# Patient Record
Sex: Female | Born: 1960 | Race: White | Hispanic: No | Marital: Married | Smoking: Former smoker
Health system: Southern US, Community
[De-identification: ages and names within clinical notes are randomized; demographics above are authoritative.]

## PROBLEM LIST (undated history)

## (undated) HISTORY — PX: DILATION AND CURETTAGE OF UTERUS: SHX78

## (undated) HISTORY — PX: OVARIAN CYST REMOVAL: SHX89

## (undated) HISTORY — PX: TONSILLECTOMY: SUR1361

## (undated) HISTORY — PX: CHOLECYSTECTOMY: SHX55

## (undated) HISTORY — PX: ABDOMINAL HYSTERECTOMY: SHX81

---

## 2005-11-17 ENCOUNTER — Ambulatory Visit: Payer: Self-pay | Admitting: Unknown Physician Specialty

## 2006-11-02 ENCOUNTER — Emergency Department: Payer: Self-pay | Admitting: Emergency Medicine

## 2008-01-01 ENCOUNTER — Encounter: Payer: Self-pay | Admitting: Internal Medicine

## 2008-01-01 ENCOUNTER — Ambulatory Visit: Payer: Self-pay | Admitting: Family Medicine

## 2008-01-02 ENCOUNTER — Encounter: Payer: Self-pay | Admitting: Internal Medicine

## 2008-01-18 ENCOUNTER — Ambulatory Visit: Payer: Self-pay | Admitting: Unknown Physician Specialty

## 2008-06-06 ENCOUNTER — Emergency Department: Payer: Self-pay | Admitting: Emergency Medicine

## 2008-06-06 ENCOUNTER — Other Ambulatory Visit: Payer: Self-pay

## 2008-09-12 ENCOUNTER — Ambulatory Visit: Payer: Self-pay | Admitting: Unknown Physician Specialty

## 2008-09-24 ENCOUNTER — Ambulatory Visit: Payer: Self-pay | Admitting: Unknown Physician Specialty

## 2009-04-01 ENCOUNTER — Ambulatory Visit: Payer: Self-pay | Admitting: General Surgery

## 2015-06-14 ENCOUNTER — Emergency Department: Payer: BLUE CROSS/BLUE SHIELD

## 2015-06-14 ENCOUNTER — Emergency Department
Admission: EM | Admit: 2015-06-14 | Discharge: 2015-06-14 | Disposition: A | Discharge status: Home / Self Care | Payer: BLUE CROSS/BLUE SHIELD | Attending: Emergency Medicine | Admitting: Emergency Medicine

## 2015-06-14 DIAGNOSIS — Z87891 Personal history of nicotine dependence: Secondary | ICD-10-CM | POA: Diagnosis not present

## 2015-06-14 DIAGNOSIS — R1031 Right lower quadrant pain: Secondary | ICD-10-CM | POA: Diagnosis present

## 2015-06-14 DIAGNOSIS — R102 Pelvic and perineal pain: Secondary | ICD-10-CM

## 2015-06-14 DIAGNOSIS — N858 Other specified noninflammatory disorders of uterus: Secondary | ICD-10-CM | POA: Diagnosis not present

## 2015-06-14 LAB — COMPREHENSIVE METABOLIC PANEL
ALT: 17 U/L (ref 14–54)
ANION GAP: 6 (ref 5–15)
AST: 16 U/L (ref 15–41)
Albumin: 4.3 g/dL (ref 3.5–5.0)
Alkaline Phosphatase: 74 U/L (ref 38–126)
BILIRUBIN TOTAL: 0.8 mg/dL (ref 0.3–1.2)
BUN: 12 mg/dL (ref 6–20)
CHLORIDE: 105 mmol/L (ref 101–111)
CO2: 27 mmol/L (ref 22–32)
Calcium: 8.8 mg/dL — ABNORMAL LOW (ref 8.9–10.3)
Creatinine, Ser: 0.65 mg/dL (ref 0.44–1.00)
GFR calc Af Amer: >60 mL/min (ref >60)
Glucose, Bld: 111 mg/dL — ABNORMAL HIGH (ref 65–99)
Potassium: 4 mmol/L (ref 3.5–5.1)
Sodium: 138 mmol/L (ref 135–145)
TOTAL PROTEIN: 7.5 g/dL (ref 6.5–8.1)

## 2015-06-14 LAB — URINALYSIS COMPLETE WITH MICROSCOPIC (ARMC ONLY)
BACTERIA UA: NONE SEEN
Bilirubin Urine: NEGATIVE
Glucose, UA: NEGATIVE mg/dL
HGB URINE DIPSTICK: NEGATIVE
Ketones, ur: NEGATIVE mg/dL
Leukocytes, UA: NEGATIVE
NITRITE: NEGATIVE
PH: 6 (ref 5.0–8.0)
PROTEIN: NEGATIVE mg/dL
RBC / HPF: NONE SEEN RBC/hpf (ref 0–5)
SQUAMOUS EPITHELIAL / LPF: NONE SEEN
Specific Gravity, Urine: 1.004 — ABNORMAL LOW (ref 1.005–1.030)
WBC UA: NONE SEEN WBC/hpf (ref 0–5)

## 2015-06-14 LAB — CBC
HEMATOCRIT: 42.9 % (ref 35.0–47.0)
HEMOGLOBIN: 13.9 g/dL (ref 12.0–16.0)
MCH: 29.1 pg (ref 26.0–34.0)
MCHC: 32.3 g/dL (ref 32.0–36.0)
MCV: 89.9 fL (ref 80.0–100.0)
Platelets: 278 10*3/uL (ref 150–440)
RBC: 4.77 MIL/uL (ref 3.80–5.20)
RDW: 13.1 % (ref 11.5–14.5)
WBC: 5.1 10*3/uL (ref 3.6–11.0)

## 2015-06-14 MED ORDER — IOHEXOL 240 MG/ML SOLN
25.0000 mL | Freq: Once | INTRAMUSCULAR | Status: AC | PRN
  Administered 2015-06-14: 25 mL via ORAL

## 2015-06-14 MED ORDER — IOHEXOL 300 MG/ML  SOLN
100.0000 mL | Freq: Once | INTRAMUSCULAR | Status: AC | PRN
  Administered 2015-06-14: 100 mL via INTRAVENOUS

## 2015-06-14 MED ORDER — DICYCLOMINE HCL 10 MG PO CAPS
20.0000 mg | ORAL_CAPSULE | Freq: Once | ORAL | Status: AC
Start: 2015-06-14 — End: 2015-06-14
  Administered 2015-06-14: 20 mg via ORAL
  Filled 2015-06-14: qty 2

## 2015-06-14 MED ORDER — DICYCLOMINE HCL 20 MG PO TABS
20.0000 mg | ORAL_TABLET | Freq: Three times a day (TID) | ORAL | Status: AC | PRN
Start: 2015-06-14 — End: 2016-06-13

## 2015-06-14 NOTE — ED Notes (Signed)
Discharge instructions, follow-up care, and prescription reviewed with patient. No questions or concerns at this time.  

## 2015-06-14 NOTE — ED Notes (Signed)
Pt here c/o RLQ abdominal pain that began yesterday after lunch  Nausea present  Normal BM this am  8/10 pain reported

## 2015-06-14 NOTE — Discharge Instructions (Signed)
Please seek medical attention for any high fevers, chest pain, shortness of breath, change in behavior, persistent vomiting, bloody stool or any other new or concerning symptoms. ° °Abdominal Pain °Many things can cause abdominal pain. Usually, abdominal pain is not caused by a disease and will improve without treatment. It can often be observed and treated at home. Your health care provider will do a physical exam and possibly order blood tests and X-rays to help determine the seriousness of your pain. However, in many cases, more time must pass before a clear cause of the pain can be found. Before that point, your health care provider may not know if you need more testing or further treatment. °HOME CARE INSTRUCTIONS  °Monitor your abdominal pain for any changes. The following actions may help to alleviate any discomfort you are experiencing: °· Only take over-the-counter or prescription medicines as directed by your health care provider. °· Do not take laxatives unless directed to do so by your health care provider. °· Try a clear liquid diet (broth, tea, or water) as directed by your health care provider. Slowly move to a Sayer diet as tolerated. °SEEK MEDICAL CARE IF: °· You have unexplained abdominal pain. °· You have abdominal pain associated with nausea or diarrhea. °· You have pain when you urinate or have a bowel movement. °· You experience abdominal pain that wakes you in the night. °· You have abdominal pain that is worsened or improved by eating food. °· You have abdominal pain that is worsened with eating fatty foods. °· You have a fever. °SEEK IMMEDIATE MEDICAL CARE IF:  °· Your pain does not go away within 2 hours. °· You keep throwing up (vomiting). °· Your pain is felt only in portions of the abdomen, such as the right side or the left lower portion of the abdomen. °· You pass bloody or black tarry stools. °MAKE SURE YOU: °· Understand these instructions.   °· Will watch your condition.   °· Will  get help right away if you are not doing well or get worse.   °Document Released: 06/29/2005 Document Revised: 09/24/2013 Document Reviewed: 05/29/2013 °[employer]® Patient Information ©2015 [employer]. This information is not intended to replace advice given to you by your health care provider. Make sure you discuss any questions you have with your health care provider. ° °

## 2015-06-14 NOTE — ED Notes (Signed)
CT notified pt done with PO contrast

## 2015-06-14 NOTE — ED Notes (Signed)
Patient transported to CT 

## 2015-06-14 NOTE — ED Provider Notes (Signed)
[facility] Emergency Department Provider Note    ____________________________________________  Time seen: 0935  I have reviewed the triage vital signs and the nursing notes.   HISTORY  Chief Complaint Abdominal Pain and Nausea   History limited by: Not Limited   HPI Jenny Johnston is a 54 y.o. female who presents to the emergency department today with right lower quadrant pain. She states the pain started yesterday at around noon. She states that the pain initially was in the central part of her stomach but now has moved to the right lower quadrant. She describes it as a constant dull pain with occasional sharp periods. She states that she has had some nausea but no vomiting. She did have a bowel movement this morning. She did not notice any blood. No pain with urination. No fevers but she has had chills.     History reviewed. No pertinent past medical history.  There are no active problems to display for this patient.   Past Surgical History  Procedure Laterality Date  . Cholecystectomy    . Abdominal hysterectomy    . Cesarean section    . Ovarian cyst removal    . Tonsillectomy    . Dilation and curettage of uterus      No current outpatient prescriptions on file.  Allergies Other and Shellfish allergy  History reviewed. No pertinent family history.  Social History Social History  Substance Use Topics  . Smoking status: Former Games developer  . Smokeless tobacco: None  . Alcohol Use: No    Review of Systems  Constitutional: positive for chills Cardiovascular: Negative for chest pain. Respiratory: Negative for shortness of breath. Gastrointestinal: positive for right lower quadrant pain Genitourinary: Negative for dysuria. Musculoskeletal: Negative for back pain. Skin: Negative for rash. Neurological: Negative for headaches, focal weakness or numbness.  10-point ROS otherwise  negative.  ____________________________________________   PHYSICAL EXAM:  VITAL SIGNS: ED Triage Vitals  Enc Vitals Group     BP 06/14/15 0851 122/57 mmHg     Pulse Rate 06/14/15 0851 63     Resp --      Temp 06/14/15 0851 97.6 F (36.4 C)     Temp Source 06/14/15 0851 Oral     SpO2 06/14/15 0851 96 %     Weight 06/14/15 0851 260 lb (117.935 kg)     Height 06/14/15 0851 5\' 2"  (1.575 m)     Head Cir --      Peak Flow --      Pain Score 06/14/15 0852 8   Constitutional: Alert and oriented. Well appearing and in no distress. Eyes: Conjunctivae are normal. PERRL. Normal extraocular movements. ENT   Head: Normocephalic and atraumatic.   Nose: No congestion/rhinnorhea.   Mouth/Throat: Mucous membranes are moist.   Neck: No stridor. Hematological/Lymphatic/Immunilogical: No cervical lymphadenopathy. Cardiovascular: Normal rate, regular rhythm.  No murmurs, rubs, or gallops. Respiratory: Normal respiratory effort without tachypnea nor retractions. Breath sounds are clear and equal bilaterally. No wheezes/rales/rhonchi. Gastrointestinal: Soft and tender to palpation in the right lower quadrant. No distention. There is no CVA tenderness. Genitourinary: Deferred Musculoskeletal: Normal range of motion in all extremities. No joint effusions.  No lower extremity tenderness nor edema. Neurologic:  Normal speech and language. No gross focal neurologic deficits are appreciated. Speech is normal.  Skin:  Skin is warm, dry and intact. No rash noted. Psychiatric: Mood and affect are normal. Speech and behavior are normal. Patient exhibits appropriate insight and judgment.  ____________________________________________  LABS (pertinent positives/negatives)  Labs Reviewed  COMPREHENSIVE METABOLIC PANEL - Abnormal; Notable for the following:    Glucose, Bld 111 (*)    Calcium 8.8 (*)    All other components within normal limits  URINALYSIS COMPLETEWITH MICROSCOPIC ([facility]  ONLY) - Abnormal; Notable for the following:    Color, Urine COLORLESS (*)    APPearance CLEAR (*)    Specific Gravity, Urine 1.004 (*)    All other components within normal limits  CBC     ____________________________________________   EKG  None  ____________________________________________    RADIOLOGY  CT abd/pel  IMPRESSION: 1. No acute findings identified within the abdomen or pelvis. The appendix is not visualized. No secondary signs of acute appendicitis noted in the right lower quadrant of the abdomen.  I, Phineas Semen, personally viewed and evaluated these images (CT abd/pel) as part of my medical decision making.   US Transvaginal  IMPRESSION: Post hysterectomy exam. Neither ovary is seen but no adnexal mass is identified. ____________________________________________   PROCEDURES  Procedure(s) performed: None  Critical Care performed: No  ____________________________________________   INITIAL IMPRESSION / ASSESSMENT AND PLAN / ED COURSE  Pertinent labs & imaging results that were available during my care of the patient were reviewed by me and considered in my medical decision making (see chart for details).  Patient presented to the emergency department today with right lower quadrant pain. Initial thought was that this could represent appendicitis given that it initially started in the central region and migrated to the right lower quadrant. CT scan was obtained which did not show any signs of appendicitis or other intra-abdominal pathology. An ultrasound was then obtained which and partially cannot visualize the ovaries however did not have any other concerning findings. At this point given reassuring blood work will have patient follow-up with primary care. Encouraged PCP and OB/GYN follow-up. Will discharge home with course of Bentyl. Discussed return precautions.  ____________________________________________   FINAL CLINICAL IMPRESSION(S) /  ED DIAGNOSES  Final diagnoses:  Right adnexal tenderness  Right lower quadrant pain     Phineas Semen, MD 06/14/15 1423

## 2015-08-17 ENCOUNTER — Emergency Department: Payer: BLUE CROSS/BLUE SHIELD

## 2015-08-17 ENCOUNTER — Emergency Department
Admission: EM | Admit: 2015-08-17 | Discharge: 2015-08-17 | Disposition: A | Discharge status: Home / Self Care | Payer: BLUE CROSS/BLUE SHIELD | Attending: Emergency Medicine | Admitting: Emergency Medicine

## 2015-08-17 ENCOUNTER — Encounter: Payer: Self-pay | Admitting: Emergency Medicine

## 2015-08-17 DIAGNOSIS — Y998 Other external cause status: Secondary | ICD-10-CM | POA: Insufficient documentation

## 2015-08-17 DIAGNOSIS — Z23 Encounter for immunization: Secondary | ICD-10-CM | POA: Insufficient documentation

## 2015-08-17 DIAGNOSIS — S060X0A Concussion without loss of consciousness, initial encounter: Secondary | ICD-10-CM | POA: Diagnosis not present

## 2015-08-17 DIAGNOSIS — Z87891 Personal history of nicotine dependence: Secondary | ICD-10-CM | POA: Diagnosis not present

## 2015-08-17 DIAGNOSIS — S20212A Contusion of left front wall of thorax, initial encounter: Secondary | ICD-10-CM

## 2015-08-17 DIAGNOSIS — Y9389 Activity, other specified: Secondary | ICD-10-CM | POA: Insufficient documentation

## 2015-08-17 DIAGNOSIS — E041 Nontoxic single thyroid nodule: Secondary | ICD-10-CM

## 2015-08-17 DIAGNOSIS — Y92410 Unspecified street and highway as the place of occurrence of the external cause: Secondary | ICD-10-CM | POA: Insufficient documentation

## 2015-08-17 DIAGNOSIS — Z79899 Other long term (current) drug therapy: Secondary | ICD-10-CM | POA: Insufficient documentation

## 2015-08-17 DIAGNOSIS — S0101XA Laceration without foreign body of scalp, initial encounter: Secondary | ICD-10-CM

## 2015-08-17 DIAGNOSIS — S0990XA Unspecified injury of head, initial encounter: Secondary | ICD-10-CM | POA: Diagnosis present

## 2015-08-17 MED ORDER — LIDOCAINE-EPINEPHRINE (PF) 1 %-1:200000 IJ SOLN
INTRAMUSCULAR | Status: AC
  Administered 2015-08-17: 20:00:00
  Filled 2015-08-17: qty 30

## 2015-08-17 MED ORDER — TETANUS-DIPHTHERIA TOXOIDS TD 5-2 LFU IM INJ
0.5000 mL | INJECTION | Freq: Once | INTRAMUSCULAR | Status: AC
  Administered 2015-08-17: 0.5 mL via INTRAMUSCULAR
  Filled 2015-08-17: qty 0.5
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
## 2015-08-17 NOTE — ED Notes (Signed)
Patient transported to CT 

## 2015-08-17 NOTE — Discharge Instructions (Signed)
Concussion, Adult A concussion is a brain injury. It is caused by:  A hit to the head.  A quick and sudden movement (jolt) of the head or neck. A concussion is usually not life threatening. Even so, it can cause serious problems. If you had a concussion before, you may have concussion-like problems after a hit to your head. HOME CARE General Instructions  Follow your doctor's directions carefully.  Take medicines only as told by your doctor.  Only take medicines your doctor says are safe.  Do not drink alcohol until your doctor says it is okay. Alcohol and some drugs can slow down healing. They can also put you at risk for further injury.  If you are having trouble remembering things, write them down.  Try to do one thing at a time if you get distracted easily. For example, do not watch TV while making dinner.  Talk to your family members or close friends when making important decisions.  Follow up with your doctor as told.  Watch your symptoms. Tell others to do the same. Serious problems can sometimes happen after a concussion. Older adults are more likely to have these problems.  Tell your teachers, school nurse, school counselor, coach, Product/process development scientist, or work Freight forwarder about your concussion. Tell them about what you can or cannot do. They should watch to see if:  It gets even harder for you to pay attention or concentrate.  It gets even harder for you to remember things or learn new things.  You need more time than normal to finish things.  You become annoyed (irritable) more than before.  You are not able to deal with stress as well.  You have more problems than before.  Rest. Make sure you:  Get plenty of sleep at night.  Go to sleep early.  Go to bed at the same time every day. Try to wake up at the same time.  Rest during the day.  Take naps when you feel tired.  Limit activities where you have to think a lot or concentrate. These include:  Doing  homework.  Doing work related to a job.  Watching TV.  Using the computer. Returning To Your Regular Activities Return to your normal activities slowly, not all at once. You must give your body and brain enough time to heal.   Do not play sports or do other athletic activities until your doctor says it is okay.  Ask your doctor when you can drive, ride a bicycle, or work other vehicles or machines. Never do these things if you feel dizzy.  Ask your doctor about when you can return to work or school. Preventing Another Concussion It is very important to avoid another brain injury, especially before you have healed. In rare cases, another injury can lead to permanent brain damage, brain swelling, or death. The risk of this is greatest during the first 7-10 days after your injury. Avoid injuries by:   Wearing a seat belt when riding in a car.  Not drinking too much alcohol.  Avoiding activities that could lead to a second concussion (such as contact sports).  Wearing a helmet when doing activities like:  Biking.  Skiing.  Skateboarding.  Skating.  Making your home safer by:  Removing things from the floor or stairways that could make you trip.  Using grab bars in bathrooms and handrails by stairs.  Placing non-slip mats on floors and in bathtubs.  Improve lighting in dark areas. GET HELP IF:  It gets even harder for you to pay attention or concentrate.  It gets even harder for you to remember things or learn new things.  You need more time than normal to finish things.  You become annoyed (irritable) more than before.  You are not able to deal with stress as well.  You have more problems than before.  You have problems keeping your balance.  You are not able to react quickly when you should. Get help if you have any of these problems for more than 2 weeks:   Lasting (chronic) headaches.  Dizziness or trouble balancing.  Feeling sick to your stomach  (nausea).  Seeing (vision) problems.  Being affected by noises or light more than normal.  Feeling sad, low, down in the dumps, blue, gloomy, or empty (depressed).  Mood changes (mood swings).  Feeling of fear or nervousness about what may happen (anxiety).  Feeling annoyed.  Memory problems.  Problems concentrating or paying attention.  Sleep problems.  Feeling tired all the time. GET HELP RIGHT AWAY IF:   You have bad headaches or your headaches get worse.  You have weakness (even if it is in one hand, leg, or part of the face).  You have loss of feeling (numbness).  You feel off balance.  You keep throwing up (vomiting).  You feel tired.  One black center of your eye (pupil) is larger than the other.  You twitch or shake violently (convulse).  Your speech is not clear (slurred).  You are more confused, easily angered (agitated), or annoyed than before.  You have more trouble resting than before.  You are unable to recognize people or places.  You have neck pain.  It is difficult to wake you up.  You have unusual behavior changes.  You pass out (lose consciousness). MAKE SURE YOU:   Understand these instructions.  Will watch your condition.  Will get help right away if you are not doing well or get worse.   This information is not intended to replace advice given to you by your health care provider. Make sure you discuss any questions you have with your health care provider.   Document Released: 09/07/2009 Document Revised: 10/10/2014 Document Reviewed: 04/11/2013 Elsevier Interactive Patient Education 2016 [employer] Inc[employer]  Blunt Chest Trauma Blunt chest trauma is an injury caused by a blow to the chest. These chest injuries can be very painful. Blunt chest trauma often results in bruised or broken (fractured) ribs. Most cases of bruised and fractured ribs from blunt chest traumas get better after 1 to 3 weeks of rest and pain medicine. Often, the  soft tissue in the chest wall is also injured, causing pain and bruising. Internal organs, such as the heart and lungs, may also be injured. Blunt chest trauma can lead to serious medical problems. This injury requires immediate medical care. CAUSES   Motor vehicle collisions.  Falls.  Physical violence.  Sports injuries. SYMPTOMS   Chest pain. The pain may be worse when you move or breathe deeply.  Shortness of breath.  Lightheadedness.  Bruising.  Tenderness.  Swelling. DIAGNOSIS  Your caregiver will do a physical exam. X-rays may be taken to look for fractures. However, minor rib fractures may not show up on X-rays until a few days after the injury. If a more serious injury is suspected, further imaging tests may be done. This may include ultrasounds, computed tomography (CT) scans, or magnetic resonance imaging (MRI). TREATMENT  Treatment depends on the severity of your  injury. Your caregiver may prescribe pain medicines and deep breathing exercises. HOME CARE INSTRUCTIONS  Limit your activities until you can move around without much pain.  Do not do any strenuous work until your injury is healed.  Put ice on the injured area.  Put ice in a plastic bag.  Place a towel between your skin and the bag.  Leave the ice on for 15-20 minutes, 03-04 times a day.  You may wear a rib belt as directed by your caregiver to reduce pain.  Practice deep breathing as directed by your caregiver to keep your lungs clear.  Only take over-the-counter or prescription medicines for pain, fever, or discomfort as directed by your caregiver. SEEK IMMEDIATE MEDICAL CARE IF:   You have increasing pain or shortness of breath.  You cough up blood.  You have nausea, vomiting, or abdominal pain.  You have a fever.  You feel dizzy, weak, or you faint. MAKE SURE YOU:  Understand these instructions.  Will watch your condition.  Will get help right away if you are not doing well or get  worse.   This information is not intended to replace advice given to you by your health care provider. Make sure you discuss any questions you have with your health care provider.   Document Released: 10/27/2004 Document Revised: 10/10/2014 Document Reviewed: 03/18/2015 Elsevier Interactive Patient Education 2016 [employer] Inc[employer]  Laceration Care, Adult A laceration is a cut that goes through all of the layers of the skin and into the tissue that is right under the skin. Some lacerations heal on their own. Others need to be closed with stitches (sutures), staples, skin adhesive strips, or skin glue. Proper laceration care minimizes the risk of infection and helps the laceration to heal better. HOW TO CARE FOR YOUR LACERATION If sutures or staples were used:  Keep the wound clean and dry.  If you were given a bandage (dressing), you should change it at least one time per day or as told by your health care provider. You should also change it if it becomes wet or dirty.  Keep the wound completely dry for the first 24 hours or as told by your health care provider. After that time, you may shower or bathe. However, make sure that the wound is not soaked in water until after the sutures or staples have been removed.  Clean the wound one time each day or as told by your health care provider:  Wash the wound with soap and water.  Rinse the wound with water to remove all soap.  Pat the wound dry with a clean towel. Do not rub the wound.  After cleaning the wound, apply a thin layer of antibiotic ointmentas told by your health care provider. This will help to prevent infection and keep the dressing from sticking to the wound.  Have the sutures or staples removed as told by your health care provider. If skin adhesive strips were used:  Keep the wound clean and dry.  If you were given a bandage (dressing), you should change it at least one time per day or as told by your health care provider. You  should also change it if it becomes dirty or wet.  Do not get the skin adhesive strips wet. You may shower or bathe, but be careful to keep the wound dry.  If the wound gets wet, pat it dry with a clean towel. Do not rub the wound.  Skin adhesive strips fall off on their  own. You may trim the strips as the wound heals. Do not remove skin adhesive strips that are still stuck to the wound. They will fall off in time. If skin glue was used:  Try to keep the wound dry, but you may briefly wet it in the shower or bath. Do not soak the wound in water, such as by swimming.  After you have showered or bathed, gently pat the wound dry with a clean towel. Do not rub the wound.  Do not do any activities that will make you sweat heavily until the skin glue has fallen off on its own.  Do not apply liquid, cream, or ointment medicine to the wound while the skin glue is in place. Using those may loosen the film before the wound has healed.  If you were given a bandage (dressing), you should change it at least one time per day or as told by your health care provider. You should also change it if it becomes dirty or wet.  If a dressing is placed over the wound, be careful not to apply tape directly over the skin glue. Doing that may cause the glue to be pulled off before the wound has healed.  Do not pick at the glue. The skin glue usually remains in place for 5-10 days, then it falls off of the skin. General Instructions  Take over-the-counter and prescription medicines only as told by your health care provider.  If you were prescribed an antibiotic medicine or ointment, take or apply it as told by your doctor. Do not stop using it even if your condition improves.  To help prevent scarring, make sure to cover your wound with sunscreen whenever you are outside after stitches are removed, after adhesive strips are removed, or when glue remains in place and the wound is healed. Make sure to wear a sunscreen  of at least 30 SPF.  Do not scratch or pick at the wound.  Keep all follow-up visits as told by your health care provider. This is important.  Check your wound every day for signs of infection. Watch for:  Redness, swelling, or pain.  Fluid, blood, or pus.  Raise (elevate) the injured area above the level of your heart while you are sitting or lying down, if possible. SEEK MEDICAL CARE IF:  You received a tetanus shot and you have swelling, severe pain, redness, or bleeding at the injection site.  You have a fever.  A wound that was closed breaks open.  You notice a bad smell coming from your wound or your dressing.  You notice something coming out of the wound, such as wood or glass.  Your pain is not controlled with medicine.  You have increased redness, swelling, or pain at the site of your wound.  You have fluid, blood, or pus coming from your wound.  You notice a change in the color of your skin near your wound.  You need to change the dressing frequently due to fluid, blood, or pus draining from the wound.  You develop a new rash.  You develop numbness around the wound. SEEK IMMEDIATE MEDICAL CARE IF:  You develop severe swelling around the wound.  Your pain suddenly increases and is severe.  You develop painful lumps near the wound or on skin that is anywhere on your body.  You have a red streak going away from your wound.  The wound is on your hand or foot and you cannot properly move a finger or  toe.  The wound is on your hand or foot and you notice that your fingers or toes look pale or bluish.   This information is not intended to replace advice given to you by your health care provider. Make sure you discuss any questions you have with your health care provider.   Document Released: 09/19/2005 Document Revised: 02/03/2015 Document Reviewed: 09/15/2014 Elsevier Interactive Patient Education 2016 [employer] Inc.  Thyroid Nodule A thyroid nodule is an  isolatedgrowth of thyroid cells that forms a lump in your thyroid gland. The thyroid gland is a butterfly-shaped gland. It is found in the lower front of your neck. This gland sends chemical messengers (hormones) through your blood to all parts of your body. These hormones are important in regulating your body temperature and helping your body to use energy. Thyroid nodules are common. Most are not cancerous (are benign). You may have one nodule or several nodules.  Different types of thyroid nodules include:  Nodules that grow and fill with fluid (thyroid cysts).  Nodules that produce too much thyroid hormone (hot nodules or hyperthyroid).  Nodules that produce no thyroid hormone (cold nodules or hypothyroid).  Nodules that form from cancer cells (thyroid cancers). CAUSES Usually, the cause of this condition is not known. RISK FACTORS Factors that make this condition more likely to develop include:  Increasing age. Thyroid nodules become more common in people who are older than 52 years of age.  Gender.  Benign thyroid nodules are more common in women.  Cancerous (malignant) thyroid nodules are more common in men.  A family history that includes:  Thyroid nodules.  Pheochromocytoma.  Thyroid carcinoma.  Hyperparathyroidism.  Certain kinds of thyroid diseases, such as Hashimoto thyroiditis.  Lack of iodine.  A history of head and neck radiation, such as from X-rays. SYMPTOMS It is common for this condition to cause no symptoms. If you have symptoms, they may include:  A lump in your lower neck.  Feeling a lump or tickle in your throat.  Pain in your neck, jaw, or ear.  Having trouble swallowing. Hot nodules may cause symptoms that include:  Weight loss.  Warm, flushed skin.  Feeling hot.  Feeling nervous.  A racing heartbeat. Cold nodules may cause symptoms that include:  Weight gain.  Dry skin.  Brittle hair. This may also occur with hair  loss.  Feeling cold.  Fatigue. Thyroid cancer nodules may cause symptoms that include:  Hard nodules that feel stuck to the thyroid gland.  Hoarseness.  Lumps in the glands near your thyroid (lymph nodes). DIAGNOSIS A thyroid nodule may be felt by your health care provider during a physical exam. This condition may also be diagnosed based on your symptoms. You may also have tests, including:  An ultrasound. This may be done to confirm the diagnosis.  A biopsy. This involves taking a sample from the nodule and looking at it under a microscope to see if the nodule is benign.  Blood tests to make sure that your thyroid is working properly.  Imaging tests such as MRI or CT scan may be done if:  Your nodule is large.  Your nodule is blocking your airway.  Cancer is suspected. TREATMENT Treatment depends on the cause and size of your nodule or nodules. If the nodule is benign, treatment may not be necessary. Your health care provider may monitor the nodule to see if it goes away without treatment. If the nodule continues to grow, is cancerous, or does not go away:  It may need to be drained with a needle.  It may need to be removed with surgery. If you have surgery, part or all of your thyroid gland may need to be removed as well. HOME CARE INSTRUCTIONS  Pay attention to any changes in your nodule.  Take over-the-counter and prescription medicines only as told by your health care provider.  Keep all follow-up visits as told by your health care provider. This is important. SEEK MEDICAL CARE IF:  Your voice changes.  You have trouble swallowing.  You have pain in your neck, ear, or jaw that is getting worse.  Your nodule gets bigger.  Your nodule starts to make it harder for you to breathe. SEEK IMMEDIATE MEDICAL CARE IF:  You have a sudden fever.  You feel very weak.  Your muscles look like they are shrinking (muscle wasting).  You have mood swings.  You feel  very restless.  You feel confused.  You are seeing or hearing things that other people do not see or hear (having hallucinations).  You feel suddenly nauseous or throw up.  You suddenly have diarrhea.  You have chest pain.  There is a loss of consciousness.   This information is not intended to replace advice given to you by your health care provider. Make sure you discuss any questions you have with your health care provider.   Document Released: 08/12/2004 Document Revised: 06/10/2015 Document Reviewed: 12/31/2014 Elsevier Interactive Patient Education [employer]

## 2016-06-04 ENCOUNTER — Encounter (HOSPITAL_COMMUNITY): Payer: Self-pay | Admitting: *Deleted

## 2016-06-04 ENCOUNTER — Emergency Department (HOSPITAL_COMMUNITY)
Admission: EM | Admit: 2016-06-04 | Discharge: 2016-06-04 | Disposition: A | Discharge status: Home / Self Care | Payer: BLUE CROSS/BLUE SHIELD | Attending: Emergency Medicine | Admitting: Emergency Medicine

## 2016-06-04 DIAGNOSIS — L03031 Cellulitis of right toe: Secondary | ICD-10-CM | POA: Diagnosis not present

## 2016-06-04 DIAGNOSIS — Z87891 Personal history of nicotine dependence: Secondary | ICD-10-CM | POA: Insufficient documentation

## 2016-06-04 DIAGNOSIS — L539 Erythematous condition, unspecified: Secondary | ICD-10-CM | POA: Diagnosis present

## 2016-06-04 MED ORDER — SULFAMETHOXAZOLE-TRIMETHOPRIM 800-160 MG PO TABS
1.0000 | ORAL_TABLET | Freq: Once | ORAL | Status: AC
  Administered 2016-06-04: 1 via ORAL
  Filled 2016-06-04: qty 1

## 2016-06-04 MED ORDER — SULFAMETHOXAZOLE-TRIMETHOPRIM 800-160 MG PO TABS: 1.0000 | ORAL_TABLET | Freq: Two times a day (BID) | ORAL | 0 refills | Status: AC

## 2016-06-04 NOTE — ED Triage Notes (Signed)
Pt has a reddened area on the right lower part of her leg. Pt also has a "bump" on the right foot with bruising in between her big toe and the toe beside it.

## 2016-06-04 NOTE — Discharge Instructions (Signed)
Warm compresses on/ off to your toe and leg.  Return here for any worsening symptoms such as swelling, numbness, red streaks, and fever

## 2016-06-04 NOTE — ED Provider Notes (Signed)
[facility]EM[facility]Y [facility]PT Provider Note   CSN: [id number] Arrival date & time: 06/04/16  2102     History   Chief Complaint Chief Complaint  Patient presents with  . Insect Bite    HPI DYLANIE QUESENBERRY is a 55 y.o. female.  HPI   TERAH ROBEY is a 55 y.o. female who presents to the Emergency Department complaining of a "red area" to her right lower leg and right great toe.  She noticed a red area to her lower leg approximately 4 days ago that is not associated with itching or pain.  Yesterday, she noticed a similar appearing area to her right great toe.  She has been applying OTC hydrocortisone cream and states the area on her leg is improving.  She denies any associated symptoms including pain, swelling, numbness or itching.  Also denies new medications, and hx of PVD or DM   History reviewed. No pertinent past medical history.  There are no active problems to display for this patient.   Past Surgical History:  Procedure Laterality Date  . ABDOMINAL HYSTERECTOMY    . CESAREAN SECTION    . CHOLECYSTECTOMY    . DILATION AND CURETTAGE OF UTERUS    . OVARIAN CYST REMOVAL    . TONSILLECTOMY      OB History    No data available       Home Medications    Prior to Admission medications   Medication Sig Start Date End Date Taking? Authorizing Provider  dicyclomine (BENTYL) 20 MG tablet Take 1 tablet (20 mg total) by mouth 3 (three) times daily as needed for spasms. 06/14/15 06/13/16  Phineas Semen, MD  ZINC SULFATE PO Take 1 capsule by mouth daily. Take Monday through Friday.    Historical Provider, MD    Family History History reviewed. No pertinent family history.  Social History Social History  Substance Use Topics  . Smoking status: Former Games developer  . Smokeless tobacco: Never Used  . Alcohol use No     Allergies   Other and Shellfish allergy   Review of Systems Review of Systems  Constitutional: Negative for activity change, appetite change, chills and fever.    HENT: Negative for facial swelling, sore throat and trouble swallowing.   Respiratory: Negative for chest tightness, shortness of breath and wheezing.   Skin: Positive for color change (right lower leg and right great toe). Negative for wound.  Neurological: Negative for dizziness, weakness, numbness and headaches.  All other systems reviewed and are negative.    Physical Exam Updated Vital Signs BP 150/92 (BP Location: Left Arm)   Pulse 74   Temp 98 F (36.7 C) (Oral)   Resp 18   Ht 5\' 2"  (1.575 m)   Wt 120.7 kg   SpO2 99%   BMI 48.65 kg/m   Physical Exam  Constitutional: She is oriented to person, place, and time. She appears well-developed and well-nourished. No distress.  HENT:  Head: Normocephalic and atraumatic.  Mouth/Throat: Oropharynx is clear and moist.  Neck: Normal range of motion. Neck supple.  Cardiovascular: Normal rate, regular rhythm and intact distal pulses.   No murmur heard. Pulmonary/Chest: Effort normal and breath sounds normal. No respiratory distress.  Musculoskeletal: She exhibits no edema or tenderness.  DP pulse of right foot is brisk, CR< 3 sec, sensation intact  Lymphadenopathy:    She has no cervical adenopathy.  Neurological: She is alert and oriented to person, place, and time. She exhibits normal muscle tone. Coordination normal.  Skin: Skin is warm. No rash noted. No erythema.  Non-blanching area to the lateral right lower leg and similar appearing area to entire right great toe with small ulceration to the lateral aspect.    Nursing note and vitals reviewed.    ED Treatments / Results  Labs (all labs ordered are listed, but only abnormal results are displayed) Labs Reviewed - No data to display  EKG  EKG Interpretation None       Radiology No results found.  Procedures Procedures (including critical care time)  Medications Ordered in ED Medications - No data to display   Initial Impression / Assessment and Plan / ED  Course  I have reviewed the triage vital signs and the nursing notes.  Pertinent labs & imaging results that were available during my care of the patient were reviewed by me and considered in my medical decision making (see chart for details).  Clinical Course   2130  Pt also seen by Dr. Particia NearingHaviland and care plan discussed.  Will include warm compresses, abx and close PMD f/u and ER return precautions given.     Final Clinical Impressions(s) / ED Diagnoses   Final diagnoses:  Cellulitis of toe of right foot    New Prescriptions New Prescriptions   No medications on file     Pauline Ausammy Vianka Ertel, PA-C 06/06/16 2250    Jacalyn LefevreJulie Haviland, MD 06/06/16 2316

## 2016-06-04 NOTE — ED Notes (Signed)
MD at bedside. 

## 2016-09-05 ENCOUNTER — Other Ambulatory Visit: Payer: Self-pay | Admitting: Obstetrics & Gynecology

## 2016-09-05 DIAGNOSIS — Z1231 Encounter for screening mammogram for malignant neoplasm of breast: Secondary | ICD-10-CM

## 2016-09-11 IMAGING — CT CT ABD-PELV W/ CM
1 of 3 series · 14 of 32 positions shown, 19 images · IV contrast (omnipaque)
Comparison: 11/03/2006

CLINICAL DATA: Right lower quadrant pain

EXAM:
CT ABDOMEN AND PELVIS WITH CONTRAST
TECHNIQUE: Multidetector CT imaging of the abdomen and pelvis was performed
using the standard protocol following bolus administration of
intravenous contrast.
CONTRAST:  100mL OMNIPAQUE IOHEXOL 300 MG/ML  SOLN

[Series 2: routine abd pel with · axial · 0.73mm/px · z∈[-234,+191]mm · 14 of 95 slices shown, 19 images]
[im 5/95  soft-tissue]
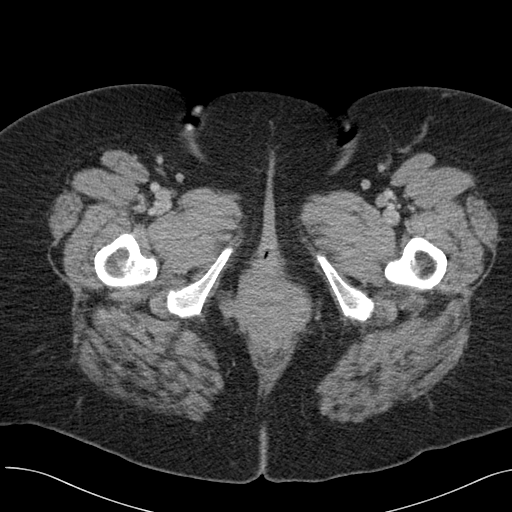
[im 5/95  bone]
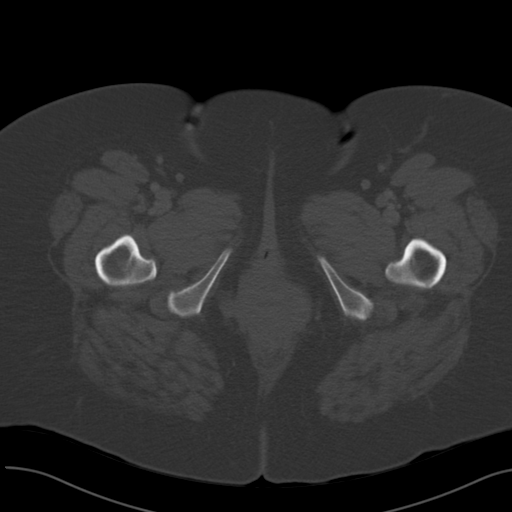
[im 15/95  soft-tissue]
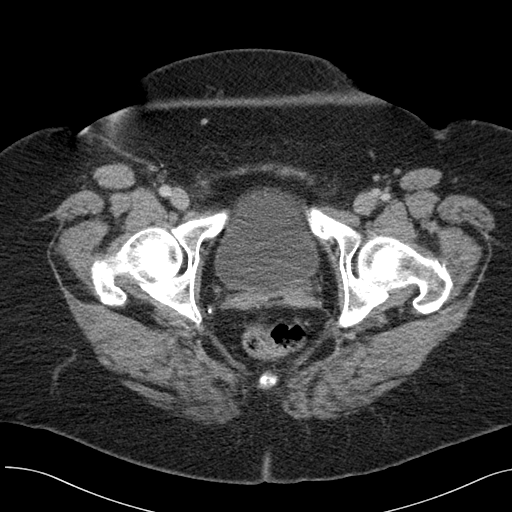
[im 20/95  soft-tissue]
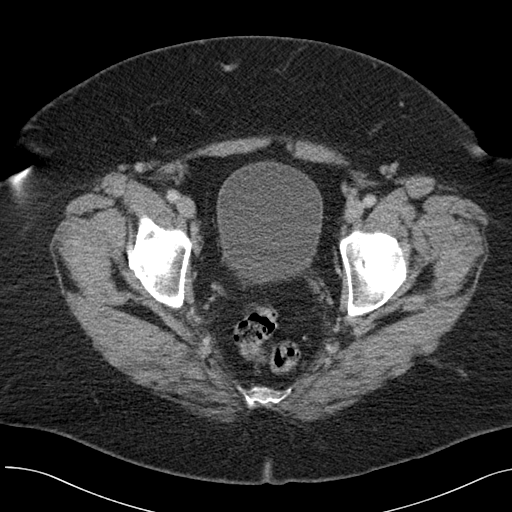
[im 25/95  soft-tissue]
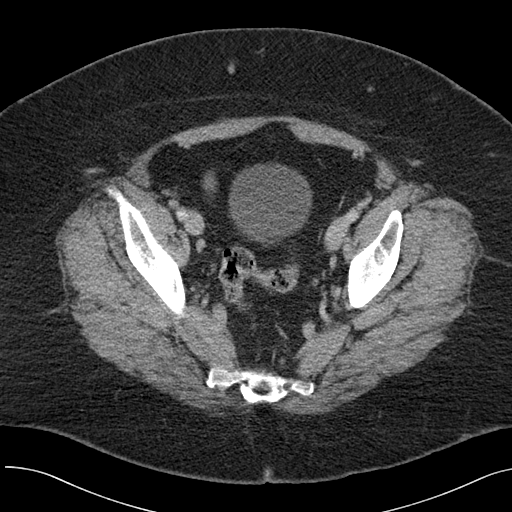
[im 35/95  soft-tissue]
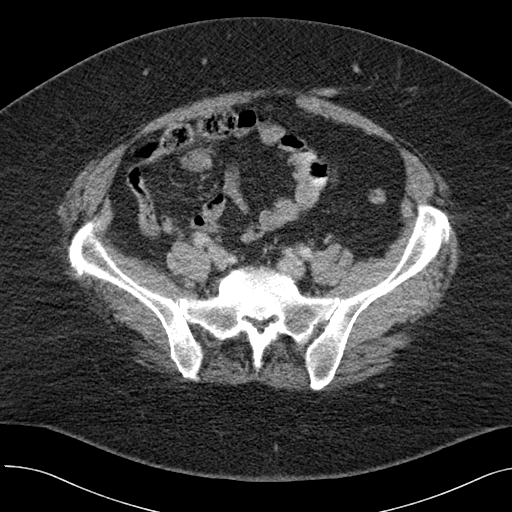
[im 40/95  soft-tissue]
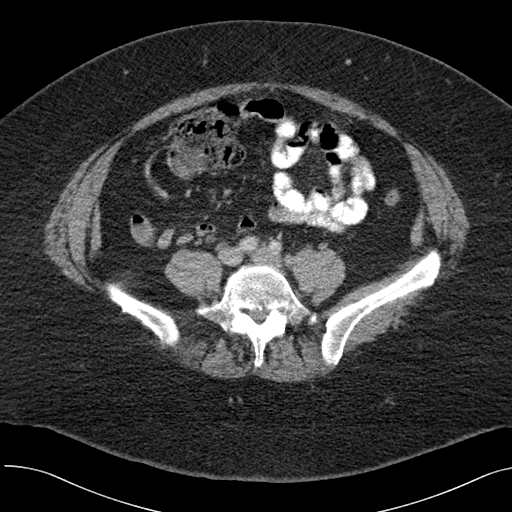
[im 50/95  soft-tissue]
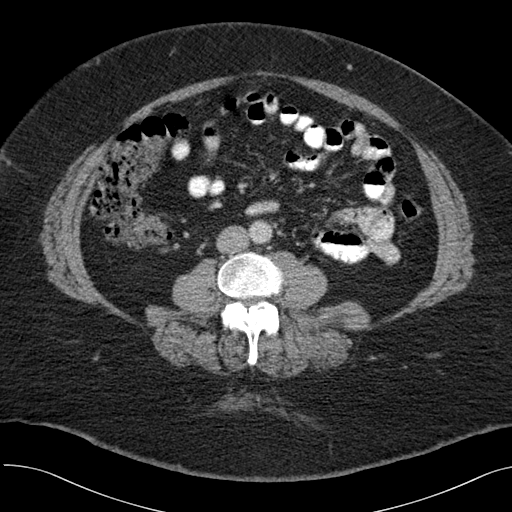
[im 55/95  soft-tissue]
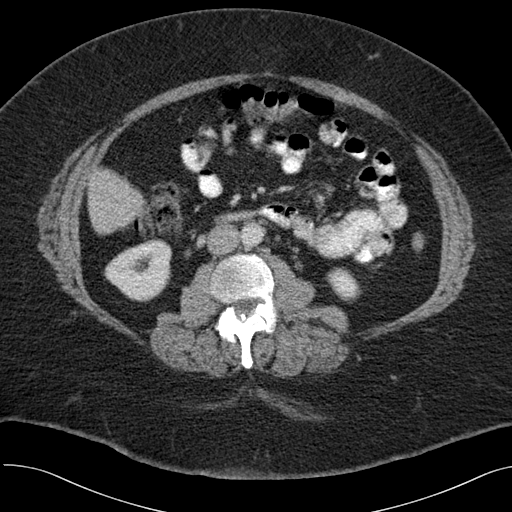
[im 60/95  soft-tissue]
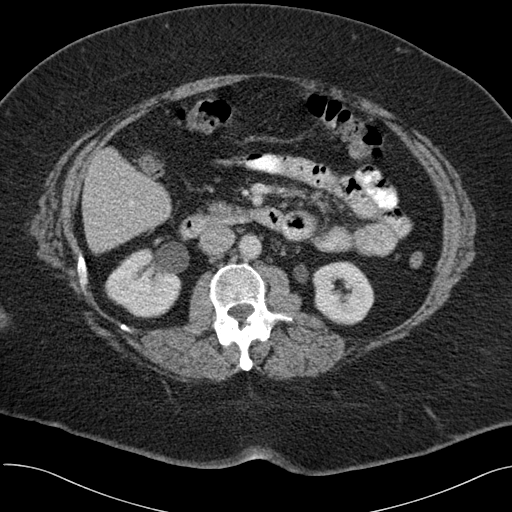
[im 60/95  bone]
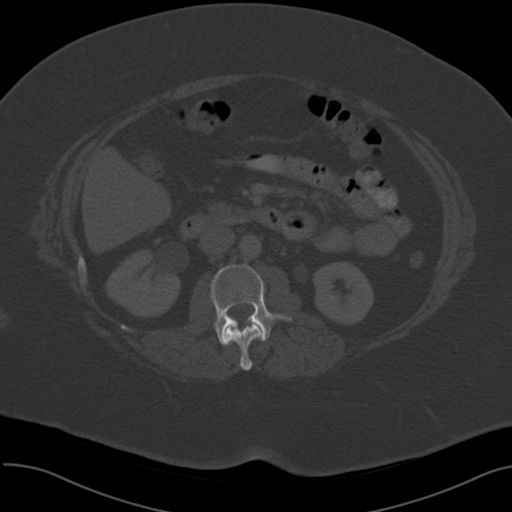
[im 70/95  soft-tissue]
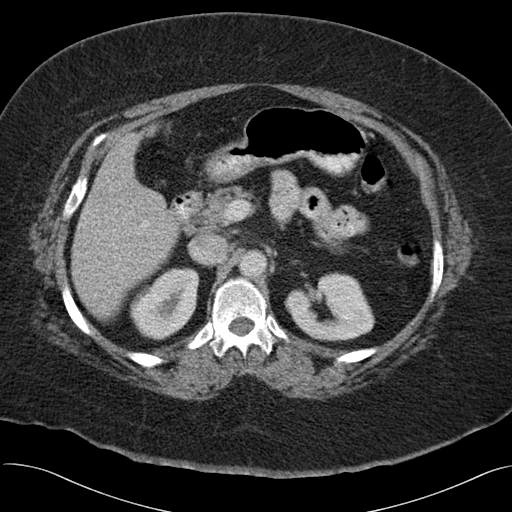
[im 75/95  soft-tissue]
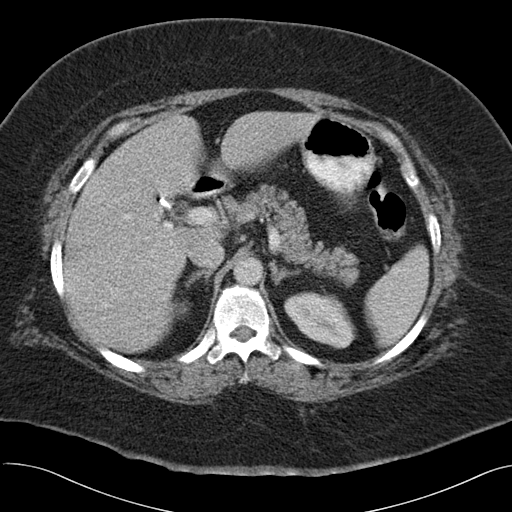
[im 75/95  lung]
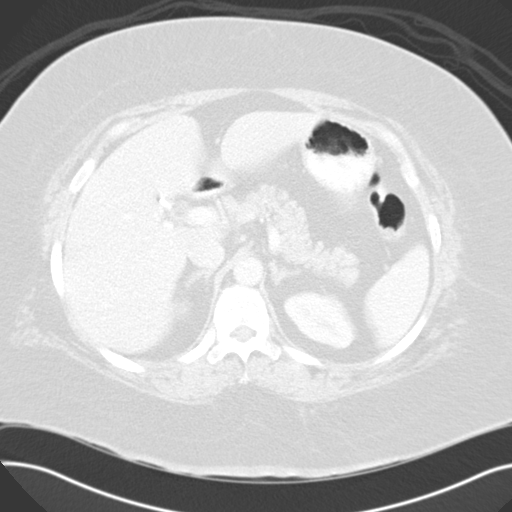
[im 80/95  soft-tissue]
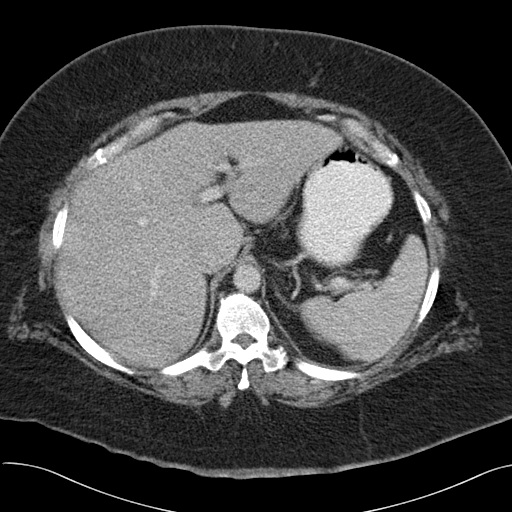
[im 80/95  lung]
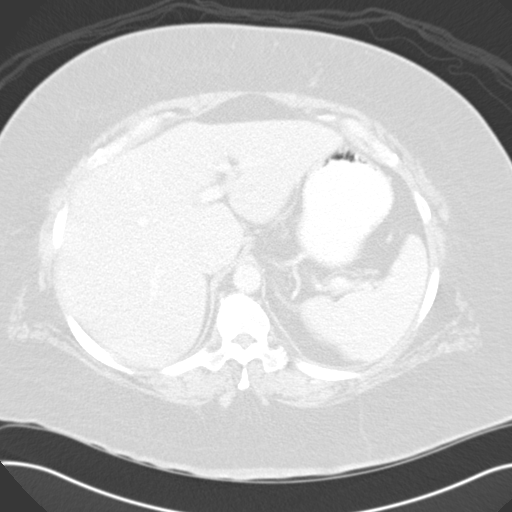
[im 85/95  lung]
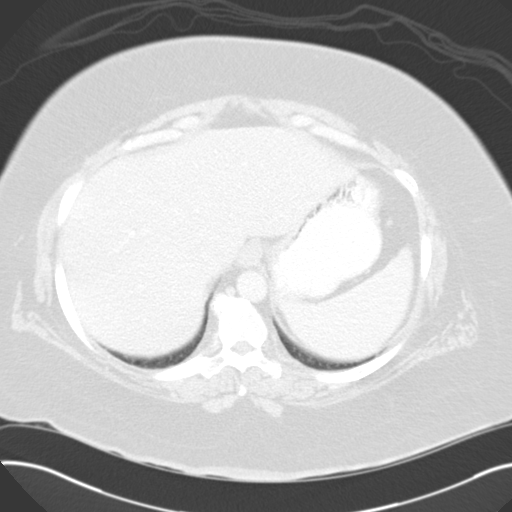
[im 90/95  soft-tissue]
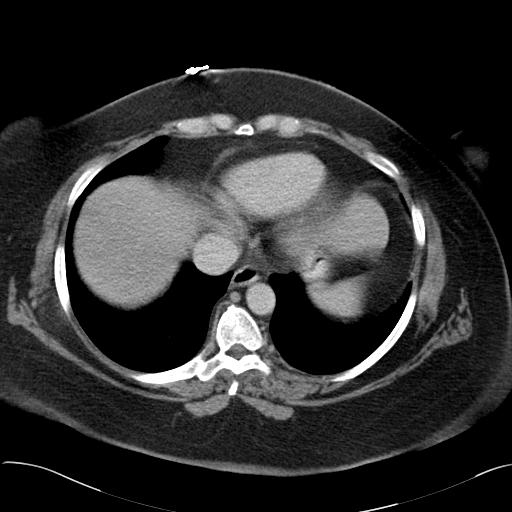
[im 90/95  lung]
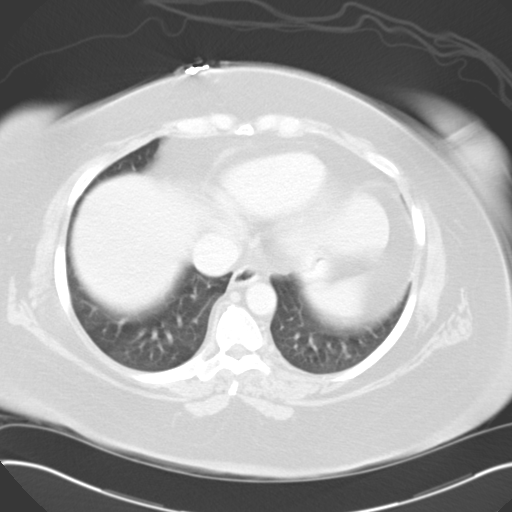

[14 of 32 positions shown; findings below may reference images not displayed]

FINDINGS: Lower chest: The lung bases are clear. No pleural or pericardial
effusion.

Hepatobiliary: There is no focal liver abnormality. Previous coli
cystectomy. No significant biliary dilatation.

Pancreas: Normal appearance of the pancreas.

Spleen: The spleen appears normal.

Adrenals/Urinary Tract: Normal adrenal glands. There is road no
focal kidney abnormality. No hydronephrosis identified. No
hydroureter or ureteral lithiasis. The urinary bladder appears
normal.

Stomach/Bowel: The stomach and the small bowel loops have a normal
course and caliber. No obstruction. Normal appearance of the colon.
The appendix is not visualized within the right lower quadrant of
the abdomen. There are no secondary signs of acute appendicitis.

Vascular/Lymphatic: Normal appearance of the abdominal aorta. No
enlarged retroperitoneal or mesenteric adenopathy. No enlarged
pelvic or inguinal lymph nodes.

Reproductive: The patient is status post hysterectomy. No adnexal
mass noted.

Other: There is no ascites or focal fluid collections within the
abdomen or pelvis.

Musculoskeletal: There is degenerative disc disease identified at
the L5-S1 level.
IMPRESSION: 1. No acute findings identified within the abdomen or pelvis. The
appendix is not visualized. No secondary signs of acute appendicitis
noted in the right lower quadrant of the abdomen.

## 2016-10-14 ENCOUNTER — Ambulatory Visit
Admission: RE | Admit: 2016-10-14 | Discharge: 2016-10-14 | Disposition: A | Discharge status: Home / Self Care | Payer: BLUE CROSS/BLUE SHIELD | Source: Ambulatory Visit | Attending: Obstetrics & Gynecology | Admitting: Obstetrics & Gynecology

## 2016-10-14 DIAGNOSIS — Z1231 Encounter for screening mammogram for malignant neoplasm of breast: Secondary | ICD-10-CM

## 2017-06-20 DIAGNOSIS — M19071 Primary osteoarthritis, right ankle and foot: Secondary | ICD-10-CM | POA: Diagnosis not present

## 2017-06-20 DIAGNOSIS — Q6651 Congenital pes planus, right foot: Secondary | ICD-10-CM | POA: Diagnosis not present

## 2017-06-20 DIAGNOSIS — M79671 Pain in right foot: Secondary | ICD-10-CM | POA: Diagnosis not present

## 2017-07-20 DIAGNOSIS — M79671 Pain in right foot: Secondary | ICD-10-CM | POA: Diagnosis not present

## 2017-07-20 DIAGNOSIS — M19071 Primary osteoarthritis, right ankle and foot: Secondary | ICD-10-CM | POA: Diagnosis not present

## 2017-08-13 IMAGING — US US TRANSVAGINAL NON-OB
1 series · 14 of 25 positions shown · non-contrast
Comparison: CT abdomen/ pelvis 06/14/2015

CLINICAL DATA: Pelvic pain. Evaluate for torsion. Prior
hysterectomy.

EXAM:
TRANSABDOMINAL AND TRANSVAGINAL ULTRASOUND OF PELVIS
DOPPLER ULTRASOUND OF OVARIES
TECHNIQUE: Both transabdominal and transvaginal ultrasound examinations of the
pelvis were performed. Transabdominal technique was performed for
global imaging of the pelvis including uterus, ovaries, adnexal
regions, and pelvic cul-de-sac.
It was necessary to proceed with endovaginal exam following the
transabdominal exam to visualize the adnexa. Color and duplex
Doppler ultrasound was utilized to evaluate blood flow to the
ovaries.

[Series 1: us transvaginal non-ob · 0.24mm/px · 14 of 75 slices shown]
[im 1/75]
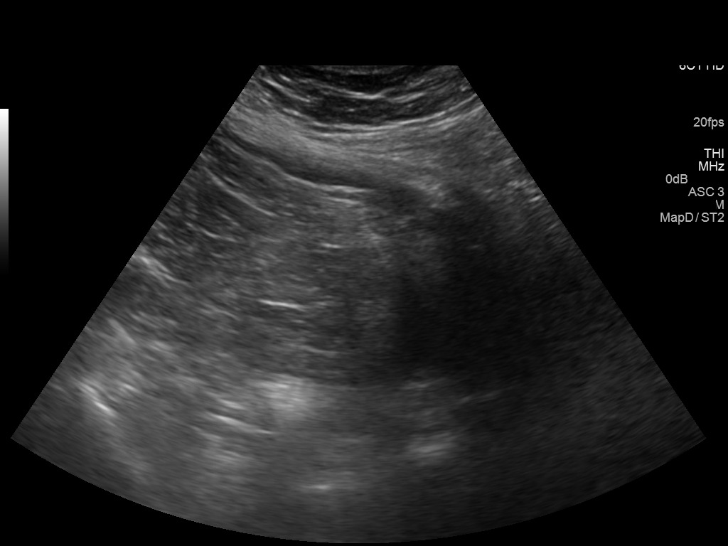
[im 7/75]
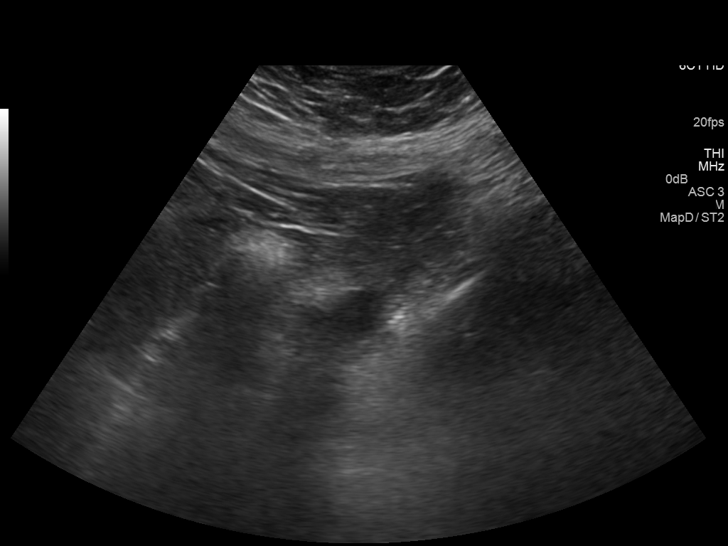
[im 13/75]
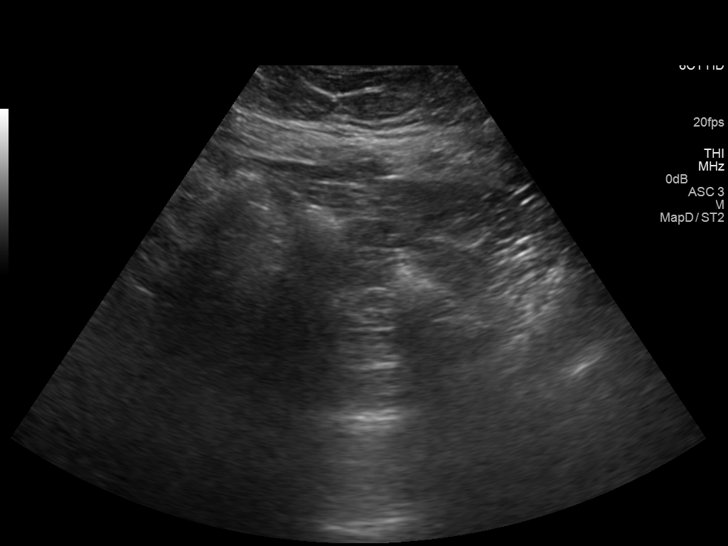
[im 19/75]
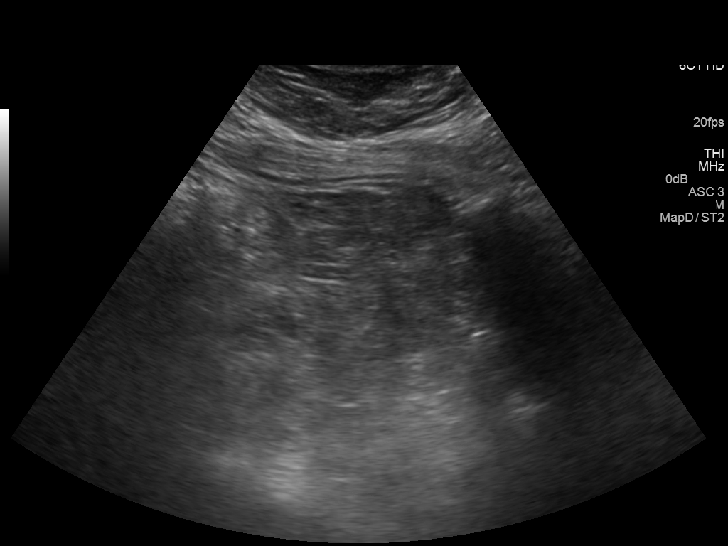
[im 25/75]
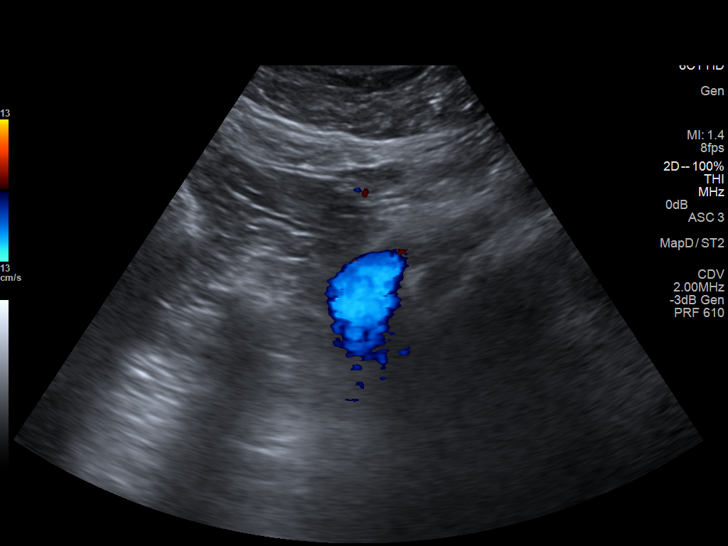
[im 28/75]
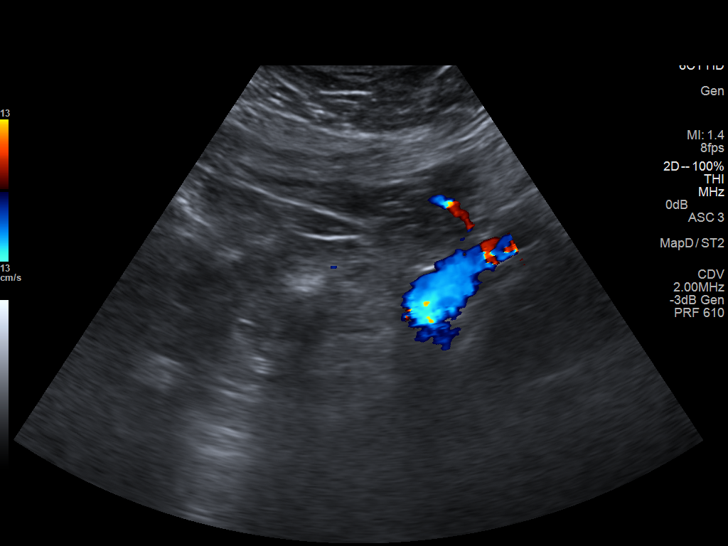
[im 34/75]
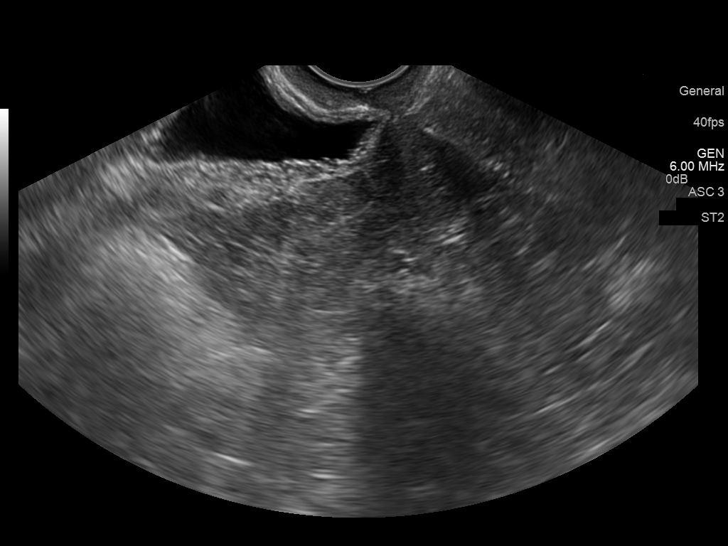
[im 41/75]
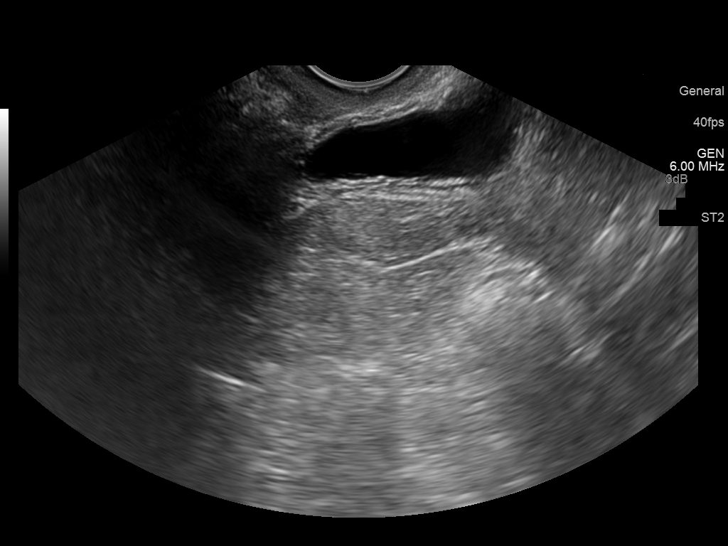
[im 47/75]
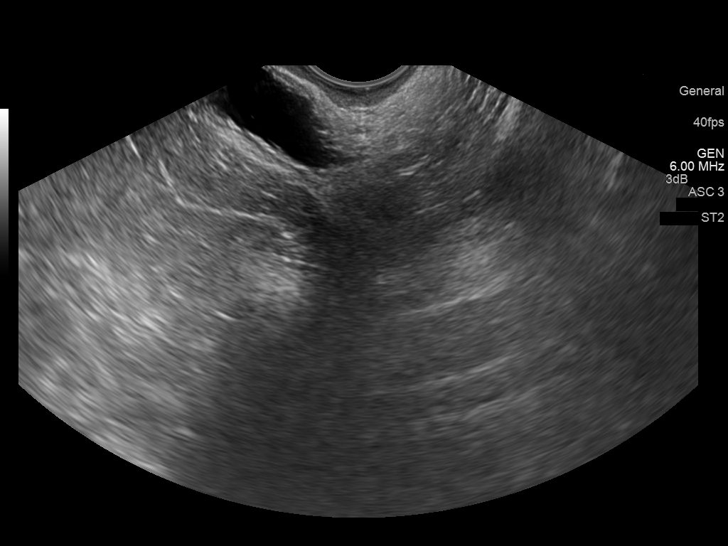
[im 50/75]
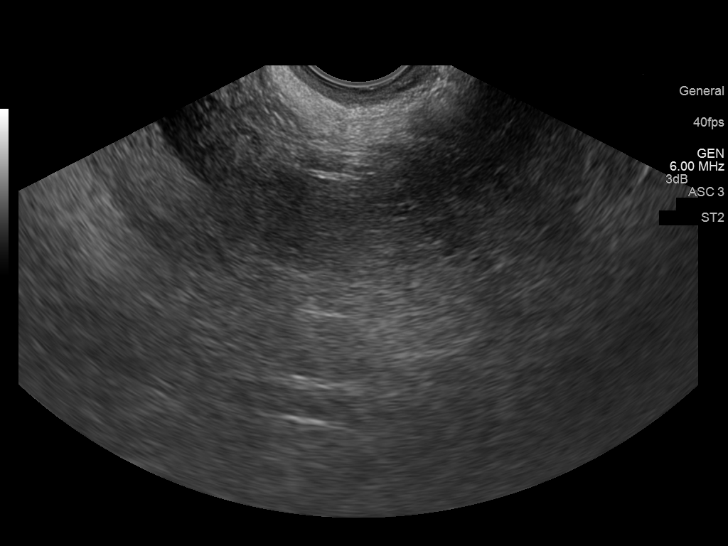
[im 56/75]
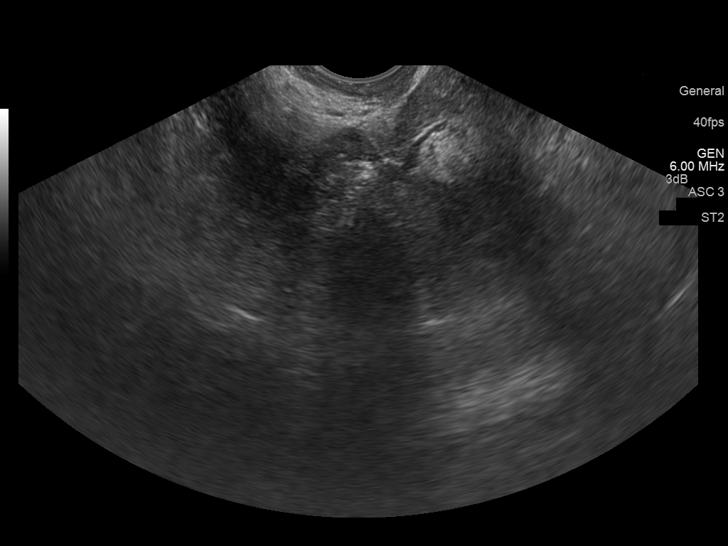
[im 62/75]
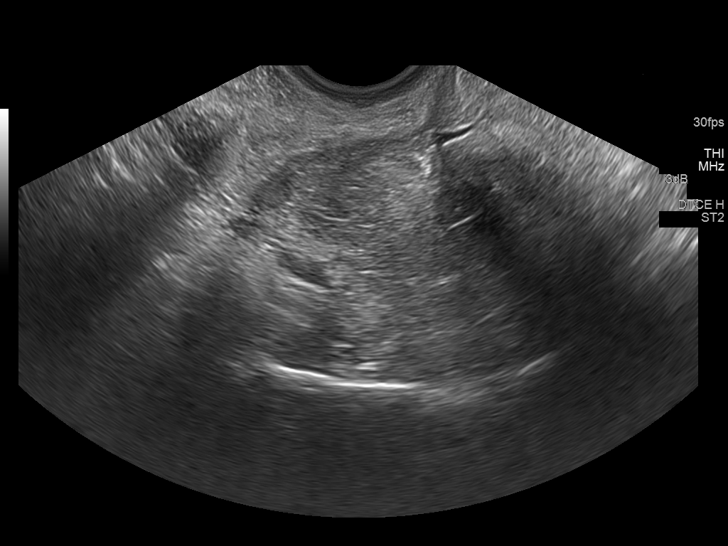
[im 68/75]
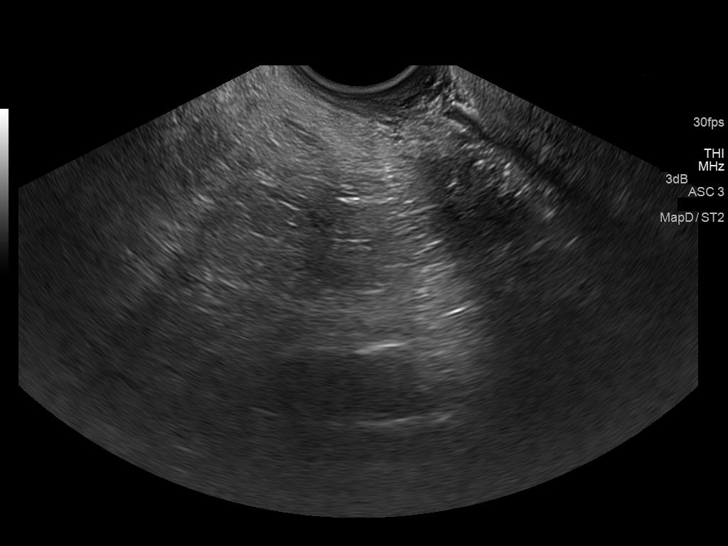
[im 75/75]
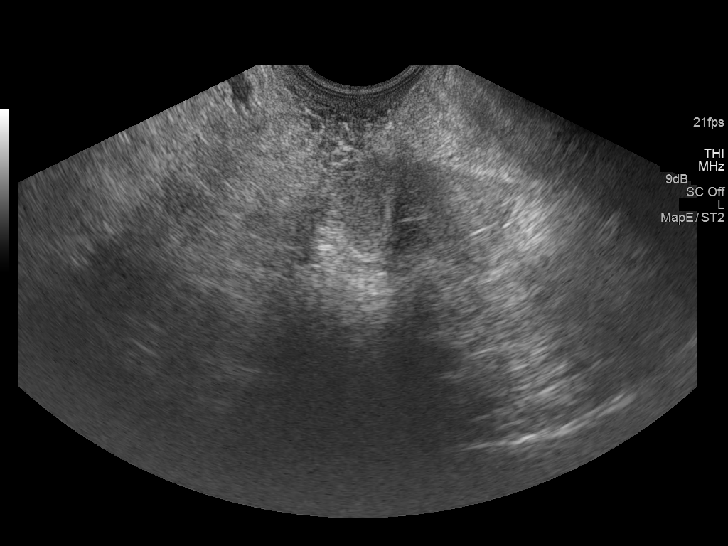

[14 of 25 positions shown; findings below may reference images not displayed]

FINDINGS: Uterus

Measurements: Surgically absent. No midline mass.

Endometrium

Thickness: Surgically absent.

Right ovary

Measurements: Not visualized. No adnexal mass

Left ovary

Measurements: Not visualized. No adnexal mass

Pulsed Doppler evaluation of both ovaries demonstrates
nonvisualization of both ovaries.

Other findings

No free fluid.
IMPRESSION: Post hysterectomy exam. Neither ovary is seen but no adnexal mass is
identified.

## 2017-10-15 DIAGNOSIS — J069 Acute upper respiratory infection, unspecified: Secondary | ICD-10-CM | POA: Diagnosis not present

## 2018-08-29 ENCOUNTER — Telehealth: Payer: Self-pay | Admitting: Obstetrics & Gynecology

## 2018-08-29 ENCOUNTER — Other Ambulatory Visit: Payer: Self-pay | Admitting: Obstetrics & Gynecology

## 2018-08-29 NOTE — Progress Notes (Signed)
BRCA Neg from 2016 testing w Dr Ammie Dalton noted

## 2018-08-29 NOTE — Telephone Encounter (Signed)
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

## 2018-08-29 NOTE — Telephone Encounter (Signed)
Called and left voice mail for patient to call back to be schedule °

## 2019-10-07 ENCOUNTER — Ambulatory Visit: Payer: Self-pay | Attending: Internal Medicine

## 2019-10-07 DIAGNOSIS — Z20822 Contact with and (suspected) exposure to covid-19: Secondary | ICD-10-CM | POA: Insufficient documentation

## 2019-10-08 LAB — NOVEL CORONAVIRUS, NAA: SARS-CoV-2, NAA: NOT DETECTED

## 2019-10-09 ENCOUNTER — Telehealth: Payer: Self-pay

## 2019-10-09 NOTE — Telephone Encounter (Signed)
Pt notified of negative COVID-19 results. Understanding verbalized.  Jenny Johnston   

## 2023-08-12 ENCOUNTER — Ambulatory Visit: Payer: Self-pay

## 2023-08-12 DIAGNOSIS — Z23 Encounter for immunization: Secondary | ICD-10-CM

## 2024-07-28 ENCOUNTER — Ambulatory Visit: Payer: Self-pay
